# Patient Record
Sex: Male | Born: 1990 | Race: Black or African American | Hispanic: No | Marital: Single | Smoking: Never smoker
Health system: Southern US, Community
[De-identification: ages and names within clinical notes are randomized; demographics above are authoritative.]

---

## 2015-03-11 ENCOUNTER — Ambulatory Visit (INDEPENDENT_AMBULATORY_CARE_PROVIDER_SITE_OTHER): Payer: BC Managed Care – PPO | Admitting: Family Medicine

## 2015-03-11 VITALS — BP 128/80 | HR 72 | Temp 98.2°F | Resp 16 | Ht 70.0 in | Wt 238.0 lb

## 2015-03-11 DIAGNOSIS — Z0000 Encounter for general adult medical examination without abnormal findings: Secondary | ICD-10-CM

## 2015-03-11 DIAGNOSIS — Z113 Encounter for screening for infections with a predominantly sexual mode of transmission: Secondary | ICD-10-CM

## 2015-03-11 NOTE — Progress Notes (Signed)
**Note Kross-Identified via Obfuscation** Subjective:     Shawn Flowers is an 25 y.o. male who presents for general physical exam. He denies any current medical problems or concerns. Questionnaire and forms reviewed with patient and responses verified. Immunizations are up to date. Patient has received 2 doses of MMR vaccine. Did not receive influenza shot this year but not interested.  Denies smoking history, social drinking, denies drug use, safe sex practices.    The following portions of the patient's history were reviewed and updated as appropriate: allergies, current medications, past family history, past medical history, past social history, past surgical history and problem list.  Review of Systems Pertinent items are noted in HPI.     Objective:    BP 128/80 mmHg  Pulse 72  Temp(Src) 98.2 F (36.8 C) (Oral)  Resp 16  Ht _0  (1.778 m)  Wt 238 lb (107.956 kg)  BMI 34.15 kg/m2  SpO2 98% BP 128/80 mmHg  Pulse 72  Temp(Src) 98.2 F (36.8 C) (Oral)  Resp 16  Ht _1  (1.778 m)  Wt 238 lb (107.956 kg)  BMI 34.15 kg/m2  SpO2 98% General appearance: alert, cooperative and appears stated age Head: Normocephalic, without obvious abnormality, atraumatic Lungs: clear to auscultation bilaterally Chest wall: no tenderness Heart: normal apical impulse and regular rate and rhythm Abdomen: soft, non-tender; bowel sounds normal; no masses,  no organomegaly Pulses: 2+ and symmetric    Assessment:     Healthy male, complete physical .    Plan:    1. Completed, signed and returned forms. 2. Reviewed health maintenance, contraception, STDs, and substance abuse. 3. Follow up will be as needed.

## 2015-03-12 LAB — HIV ANTIBODY (ROUTINE TESTING W REFLEX): HIV: NONREACTIVE

## 2015-03-12 LAB — RPR

## 2015-03-13 LAB — GC/CHLAMYDIA PROBE AMP
CT Probe RNA: NOT DETECTED
GC Probe RNA: NOT DETECTED

## 2016-07-07 ENCOUNTER — Ambulatory Visit (HOSPITAL_COMMUNITY)
Admission: EM | Admit: 2016-07-07 | Discharge: 2016-07-07 | Disposition: A | Discharge status: Home / Self Care | Payer: Self-pay | Attending: Internal Medicine | Admitting: Internal Medicine

## 2016-07-07 ENCOUNTER — Encounter (HOSPITAL_COMMUNITY): Payer: Self-pay | Admitting: Emergency Medicine

## 2016-07-07 DIAGNOSIS — M545 Low back pain: Secondary | ICD-10-CM

## 2016-07-07 DIAGNOSIS — S39012A Strain of muscle, fascia and tendon of lower back, initial encounter: Secondary | ICD-10-CM

## 2016-07-07 MED ORDER — NAPROXEN 500 MG PO TABS: 500.0000 mg | ORAL_TABLET | Freq: Two times a day (BID) | ORAL | 0 refills | Status: AC

## 2016-07-07 MED ORDER — CYCLOBENZAPRINE HCL 10 MG PO TABS: 10.0000 mg | ORAL_TABLET | Freq: Two times a day (BID) | ORAL | 0 refills | Status: AC | PRN
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
## 2018-05-10 ENCOUNTER — Other Ambulatory Visit: Payer: Self-pay

## 2018-05-10 ENCOUNTER — Emergency Department (HOSPITAL_COMMUNITY): Payer: BC Managed Care – PPO

## 2018-05-10 ENCOUNTER — Emergency Department (HOSPITAL_COMMUNITY)
Admission: EM | Admit: 2018-05-10 | Discharge: 2018-05-10 | Disposition: A | Discharge status: Home / Self Care | Payer: BC Managed Care – PPO | Attending: Emergency Medicine | Admitting: Emergency Medicine

## 2018-05-10 ENCOUNTER — Encounter (HOSPITAL_COMMUNITY): Payer: Self-pay | Admitting: Emergency Medicine

## 2018-05-10 DIAGNOSIS — Z209 Contact with and (suspected) exposure to unspecified communicable disease: Secondary | ICD-10-CM | POA: Insufficient documentation

## 2018-05-10 DIAGNOSIS — J111 Influenza due to unidentified influenza virus with other respiratory manifestations: Secondary | ICD-10-CM | POA: Insufficient documentation

## 2018-05-10 DIAGNOSIS — R69 Illness, unspecified: Secondary | ICD-10-CM

## 2018-05-10 DIAGNOSIS — R55 Syncope and collapse: Secondary | ICD-10-CM

## 2018-05-10 DIAGNOSIS — R5383 Other fatigue: Secondary | ICD-10-CM | POA: Insufficient documentation

## 2018-05-10 LAB — CBC WITH DIFFERENTIAL/PLATELET
Abs Immature Granulocytes: 0.04 10*3/uL (ref 0.00–0.07)
BASOS ABS: 0 10*3/uL (ref 0.0–0.1)
Basophils Relative: 1 %
Eosinophils Absolute: 0 10*3/uL (ref 0.0–0.5)
Eosinophils Relative: 1 %
HCT: 48.6 % (ref 39.0–52.0)
Hemoglobin: 15 g/dL (ref 13.0–17.0)
Immature Granulocytes: 1 %
Lymphocytes Relative: 17 %
Lymphs Abs: 0.9 10*3/uL (ref 0.7–4.0)
MCH: 28.5 pg (ref 26.0–34.0)
MCHC: 30.9 g/dL (ref 30.0–36.0)
MCV: 92.2 fL (ref 80.0–100.0)
Monocytes Absolute: 0.9 10*3/uL (ref 0.1–1.0)
Monocytes Relative: 16 %
NEUTROS PCT: 64 %
NRBC: 0 % (ref 0.0–0.2)
Neutro Abs: 3.5 10*3/uL (ref 1.7–7.7)
Platelets: 205 10*3/uL (ref 150–400)
RBC: 5.27 MIL/uL (ref 4.22–5.81)
RDW: 13.8 % (ref 11.5–15.5)
WBC: 5.3 10*3/uL (ref 4.0–10.5)

## 2018-05-10 LAB — URINALYSIS, ROUTINE W REFLEX MICROSCOPIC
Bilirubin Urine: NEGATIVE
Glucose, UA: NEGATIVE mg/dL
Hgb urine dipstick: NEGATIVE
Ketones, ur: NEGATIVE mg/dL
Nitrite: NEGATIVE
Protein, ur: 100 mg/dL — AB
SPECIFIC GRAVITY, URINE: 1.031 — AB (ref 1.005–1.030)
pH: 6 (ref 5.0–8.0)

## 2018-05-10 LAB — BASIC METABOLIC PANEL
Anion gap: 9 (ref 5–15)
BUN: 14 mg/dL (ref 6–20)
CO2: 25 mmol/L (ref 22–32)
Calcium: 9.5 mg/dL (ref 8.9–10.3)
Chloride: 103 mmol/L (ref 98–111)
Creatinine, Ser: 1.21 mg/dL (ref 0.61–1.24)
Glucose, Bld: 100 mg/dL — ABNORMAL HIGH (ref 70–99)
Potassium: 4.9 mmol/L (ref 3.5–5.1)
Sodium: 137 mmol/L (ref 135–145)

## 2018-05-10 LAB — INFLUENZA PANEL BY PCR (TYPE A & B)
Influenza A By PCR: NEGATIVE
Influenza B By PCR: NEGATIVE

## 2018-05-10 LAB — CBG MONITORING, ED: Glucose-Capillary: 86 mg/dL (ref 70–99)

## 2018-05-10 MED ORDER — BENZONATATE 100 MG PO CAPS: 200.0000 mg | ORAL_CAPSULE | Freq: Three times a day (TID) | ORAL | 0 refills | Status: DC

## 2018-05-10 MED ORDER — ACETAMINOPHEN 325 MG PO TABS
650.0000 mg | ORAL_TABLET | Freq: Once | ORAL | Status: AC
  Administered 2018-05-10: 650 mg via ORAL
  Filled 2018-05-10: qty 2

## 2018-05-10 MED ORDER — BENZONATATE 100 MG PO CAPS: 200.0000 mg | ORAL_CAPSULE | Freq: Three times a day (TID) | ORAL | 0 refills | Status: AC

## 2018-05-10 MED ORDER — SODIUM CHLORIDE 0.9 % IV BOLUS
1000.0000 mL | Freq: Once | INTRAVENOUS | Status: AC
  Administered 2018-05-10: 1000 mL via INTRAVENOUS

## 2018-05-10 NOTE — ED Provider Notes (Signed)
**Note Quandre-Identified via Obfuscation**  [facility]   CSN: [id number] Arrival date & time: 05/10/18  1143    History   Chief Complaint Chief Complaint  Patient presents with  . Loss of Consciousness    HPI Shawn Flowers is a 28 y.o. male who presents to ED for syncopal episode that occurred prior to arrival. States that since yesterday, he has been having subjective fever, chills, generalized bodyaches, dry cough. He woke up this morning feeling a little better so he ate a bowl of chili. He then went to the shower while he was still feeling feverish. After he finished the shower, he felt lightheaded, passed out and feels that he hit his head on the door. His roommate called EMS and patient states he came back to consciousness after a few minutes. He had one episode of NBNB emesis after this. He states right now he feels "tired but fine." He notes decreased appetite. He was around his friend who had flu like symptoms. He did not receive the influenza vaccine this year (or ever). Denies any recent international travel or contact with [phone]. He denies shortness of breath, chest pain, vision changes, neck pain, headache, numbness in arms or legs, abdominal pain.     HPI  History reviewed. No pertinent past medical history.  There are no active problems to display for this patient.   History reviewed. No pertinent surgical history.      Home Medications    Prior to Admission medications   Medication Sig Start Date End Date Taking? Authorizing Provider  PE-Doxylamine-DM-GG-APAP (TYLENOL COLD & FLU DAY/NIGHT PO) Take 2 tablets by mouth daily as needed (cold symptoms).   Yes [provider]  benzonatate (TESSALON) 100 MG capsule Take 2 capsules (200 mg total) by mouth every 8 (eight) hours. 05/10/18   Shawn Laskaris, PA-C  cyclobenzaprine (FLEXERIL) 10 MG tablet Take 1 tablet (10 mg total) by mouth 2 (two) times daily as needed for muscle spasms. Patient not taking:  Reported on 05/10/2018 07/07/16   Deatra Canter, FNP  naproxen (NAPROSYN) 500 MG tablet Take 1 tablet (500 mg total) by mouth 2 (two) times daily with a meal. Patient not taking: Reported on 05/10/2018 07/07/16   Deatra Canter, FNP    Family History Family History  Problem Relation Age of Onset  . Cancer Maternal Grandmother   . Diabetes Maternal Grandfather   . Heart disease Paternal Grandfather   . Stroke Paternal Grandfather     Social History Social History   Tobacco Use  . Smoking status: Never Smoker  . Smokeless tobacco: Never Used  Substance Use Topics  . Alcohol use: Yes    Alcohol/week: 3.0 standard drinks    Types: 3 Standard drinks or equivalent per week  . Drug use: No     Allergies   Patient has no known allergies.   Review of Systems Review of Systems  Constitutional: Positive for fever. Negative for appetite change and chills.  HENT: Positive for congestion. Negative for ear pain, rhinorrhea, sneezing and sore throat.   Eyes: Negative for photophobia and visual disturbance.  Respiratory: Positive for cough. Negative for chest tightness, shortness of breath and wheezing.   Cardiovascular: Negative for chest pain and palpitations.  Gastrointestinal: Positive for vomiting. Negative for abdominal pain, blood in stool, constipation, diarrhea and nausea.  Genitourinary: Negative for dysuria, hematuria and urgency.  Musculoskeletal: Positive for myalgias.  Skin: Negative for rash.  Neurological: Positive for syncope. Negative for dizziness, weakness **Note [name] via Obfuscation** and light-headedness.     Physical Exam Updated Vital Signs BP 135/74   Pulse 76   Temp 99.9 F (37.7 C) (Oral)   Resp (!) 23   Ht 5\' 11"  (1.803 m)   Wt 117.9 kg   SpO2 99%   BMI 36.26 kg/m   Physical Exam Vitals signs and nursing note reviewed.  Constitutional:      General: He is not in acute distress.    Appearance: He is well-developed.  HENT:     Head: Normocephalic and atraumatic.      Right Ear: Tympanic membrane is not scarred.     Left Ear: Tympanic membrane is scarred.     Nose: Nose normal.     Mouth/Throat:     Pharynx: Oropharynx is clear. Uvula midline.  Eyes:     General: No scleral icterus.       Right eye: No discharge.        Left eye: No discharge.     Conjunctiva/sclera: Conjunctivae normal.     Pupils: Pupils are equal, round, and reactive to light.  Neck:     Musculoskeletal: Normal range of motion and neck supple.  Cardiovascular:     Rate and Rhythm: Normal rate and regular rhythm.     Heart sounds: Normal heart sounds. No murmur. No friction rub. No gallop.   Pulmonary:     Effort: Pulmonary effort is normal. No respiratory distress.     Breath sounds: Normal breath sounds.  Abdominal:     General: Bowel sounds are normal. There is no distension.     Palpations: Abdomen is soft.     Tenderness: There is no abdominal tenderness. There is no right CVA tenderness, left CVA tenderness or guarding.  Musculoskeletal: Normal range of motion.     Comments: No midline spinal tenderness present in lumbar, thoracic or cervical spine. No step-off palpated. No visible bruising, edema or temperature change noted. No objective signs of numbness present. No saddle anesthesia. 2+ DP pulses bilaterally. Sensation intact to light touch. Strength 5/5 in bilateral lower extremities.  Skin:    General: Skin is warm and dry.     Findings: No rash.  Neurological:     General: No focal deficit present.     Mental Status: He is alert and oriented to person, place, and time.     Cranial Nerves: No cranial nerve deficit.     Sensory: No sensory deficit.     Motor: No weakness or abnormal muscle tone.     Coordination: Coordination normal.     Comments: Pupils reactive. No facial asymmetry noted. Cranial nerves appear grossly intact. Sensation intact to light touch on face, BUE and BLE. Strength 5/5 in BUE and BLE.       ED Treatments / Results  Labs (all labs  ordered are listed, but only abnormal results are displayed) Labs Reviewed  BASIC METABOLIC PANEL - Abnormal; Notable for the following components:      Result Value   Glucose, Bld 100 (*)    All other components within normal limits  URINALYSIS, ROUTINE W REFLEX MICROSCOPIC - Abnormal; Notable for the following components:   Color, Urine AMBER (*)    APPearance HAZY (*)    Specific Gravity, Urine 1.031 (*)    Protein, ur 100 (*)    Leukocytes,Ua TRACE (*)    Bacteria, UA RARE (*)    All other components within normal limits  URINE CULTURE  CBC WITH DIFFERENTIAL/PLATELET  INFLUENZA PANEL **Note Zaccheaus-Identified via Obfuscation** BY PCR (TYPE A & B)  CBG MONITORING, ED    EKG EKG Interpretation  Date/Time:  Thursday May 10 2018 11:56:59 EDT Ventricular Rate:  90 PR Interval:    QRS Duration: 97 QT Interval:  328 QTC Calculation: 402 R Axis:   86 Text Interpretation:  Sinus rhythm Ventricular premature complex RSR' in V1 or V2, right VCD or RVH ST elev, probable normal early repol pattern Confirmed by Benjiman Core [phone]) on 05/10/2018 2:37:05 PM   Radiology Dg Chest 2 View  Result Date: 05/10/2018 CLINICAL DATA:  Syncopal episode at home, fell in shower, increased shortness of breath, fever EXAM: CHEST - 2 VIEW COMPARISON:  None FINDINGS: Normal heart size, mediastinal contours, and pulmonary vascularity. Lungs clear. No pleural effusion or pneumothorax. Bones unremarkable. IMPRESSION: Normal exam. Electronically Signed   By: Ulyses Southward M.D.   On: 05/10/2018 12:37   Ct Head Wo Contrast  Result Date: 05/10/2018 CLINICAL DATA:  Dizziness and syncopal episode in the shower today. EXAM: CT HEAD WITHOUT CONTRAST TECHNIQUE: Contiguous axial images were obtained from the base of the skull through the vertex without intravenous contrast. COMPARISON:  None. FINDINGS: Brain: The brain shows a normal appearance without evidence of malformation, atrophy, old or acute small or large vessel infarction, mass lesion,  hemorrhage, hydrocephalus or extra-axial collection. Vascular: No hyperdense vessel. No evidence of atherosclerotic calcification. Skull: Normal.  No traumatic finding.  No focal bone lesion. Sinuses/Orbits: Sinuses are clear. Orbits appear normal. Mastoids are clear. Other: None significant IMPRESSION: Normal head CT Electronically Signed   By: Paulina Fusi M.D.   On: 05/10/2018 13:18    Procedures Procedures (including critical care time)  Medications Ordered in ED Medications  sodium chloride 0.9 % bolus 1,000 mL (0 mLs Intravenous Stopped 05/10/18 1353)  acetaminophen (TYLENOL) tablet 650 mg (650 mg Oral Given 05/10/18 1338)     Initial Impression / Assessment and Plan / ED Course  I have reviewed the triage vital signs and the nursing notes.  Pertinent labs & imaging results that were available during my care of the patient were reviewed by me and considered in my medical decision making (see chart for details).  Clinical Course as of May 10 1435  Thu May 10, 2018  1327 Orthostatic Vital Signs Orthostatic Lying  BP- Lying:134/87 Pulse- Lying:74 Orthostatic Sitting BP- Sitting:127/83 Pulse- Sitting:75 Orthostatic Standing at 0 minutes BP- Standing at 0 minutes:139/71 Pulse- Standing at 0 minutes:76   [HK]    Clinical Course User Index [HK] Dietrich Pates, PA-C       28 year old male presents to ED for syncopal episode that occurred prior to arrival.  He has been having subjective fever and chills, body aches and dry cough since yesterday.  He woke up this morning feeling a little bit better so he ate a meal.  When he went to the shower he still felt feverish and as he exited the shower felt lightheaded, passed out and hit his head on the floor.  Roommate states that he was unconscious for a few minutes.  He had one episode of nonbloody, nonbilious emesis after he regained consciousness.  States he overall feels well now but a little bit "tired."  He does note decreased  appetite.  Sick contacts including friend with flulike symptoms recently.  Denies any recent international travel or contact with anyone positive for the novel coronavirus.  Denies shortness of breath, chest pain, neck pain, headache, numbness in arms or legs. On my exam he is **Note Dejean-Identified via Obfuscation** overall well appearing. Alert, oriented x3 with no deficits to neurological exam noted. No C,T,L spine TTP. Abdomen is soft, ntnd. His vital signs are within normal limits. No signs of head trauma or bruising noted. Flu swab negative today. CBC, BMP unremarkable. UA shows protein, rare leuks and bacteria, sent for culture. CBG in 80s, patient has normal orthostatic vital signs. CXR, CT head negative. EKG shows early repol changes with ischemic findings. He denies chest pain. Patient given fluids with Tylenol with improvement in his symptoms. He is able to tolerate PO intake without difficulty. Suspect influenza like illness. Syncope could be the result of decreased appetite and fever in shower. No signs of neurological, cardiac cause of symptoms. Patient requesting discharge. Will have him take tessalon perles as needed for cough and return for any severe or worsening symptoms.  Patient is hemodynamically stable, in NAD, and able to ambulate in the ED. Evaluation does not show pathology that would require ongoing emergent intervention or inpatient treatment. I explained the diagnosis to the patient. Pain has been managed and has no complaints prior to discharge. Patient is comfortable with above plan and is stable for discharge at this time. All questions were answered prior to disposition. Strict return precautions for returning to the ED were discussed. Encouraged follow up with PCP.    Portions of this note were generated with Scientist, clinical (histocompatibility and immunogenetics). Dictation errors may occur despite best attempts at proofreading.   Final Clinical Impressions(s) / ED Diagnoses   Final diagnoses:  Influenza-like illness  Syncope, unspecified  syncope type    ED Discharge Orders         Ordered    benzonatate (TESSALON) 100 MG capsule  Every 8 hours,   Status:  Discontinued     05/10/18 1433    benzonatate (TESSALON) 100 MG capsule  Every 8 hours     05/10/18 1437           Dietrich Pates, PA-C 05/10/18 1438    Benjiman Core, MD 05/10/18 1514

## 2018-05-10 NOTE — ED Triage Notes (Signed)
**Note [name]-Identified via Obfuscation** Pt reports that he was in the shower this morning and got lightheaded then did have a syncopal episode. Reports that he vomited after he "came to". Pt said EMS came to house and told him had fever and high blood pressure.  Pt reports end of Feb traveled to [place] and [place].

## 2018-05-10 NOTE — ED Notes (Signed)
**Note Martice-identified via Obfuscation** Gave pt turkey sandwich and water 

## 2018-05-10 NOTE — Discharge Instructions (Signed)
**Note Camaron-Identified via Obfuscation** Take the tessalon perles as needed for cough. Your flu swab today was negative. Your other labwork and imaging was negative for any acute abnormality. Return to ED if you start to have worsening symptoms, shortness of breath, chest pain, numbness in arms or legs or blurry vision.

## 2018-05-10 NOTE — ED Notes (Signed)
**Note Lark-identified via Obfuscation** ED Provider at bedside. 

## 2018-05-11 LAB — URINE CULTURE: Culture: <10000 — AB

## 2020-01-25 IMAGING — CR CHEST - 2 VIEW
2 series · 2 of 2 positions shown · non-contrast
Comparison: None

CLINICAL DATA: Syncopal episode at home, fell in shower, increased
shortness of breath, fever

EXAM:
CHEST - 2 VIEW

[w chest pa]
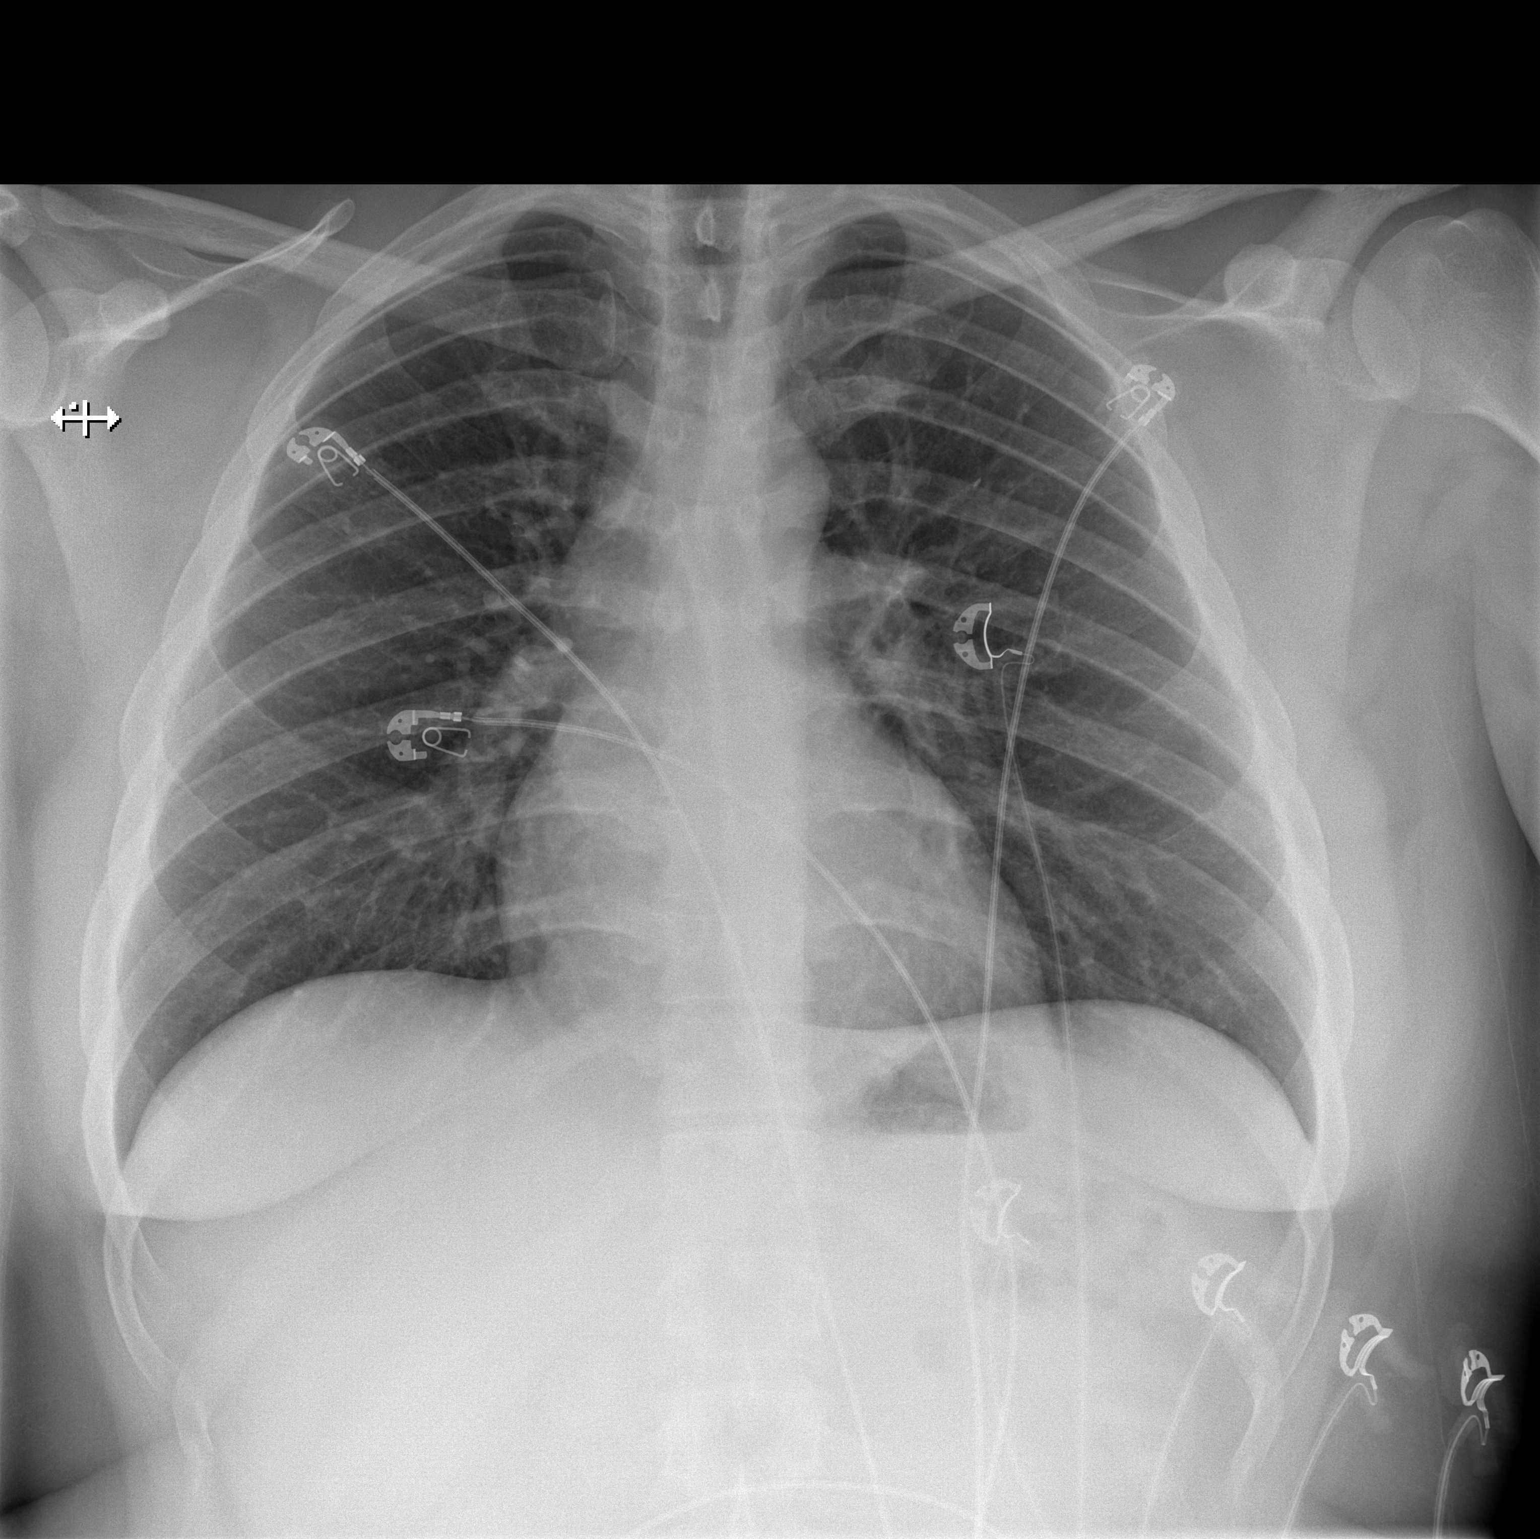

[w chest lat]
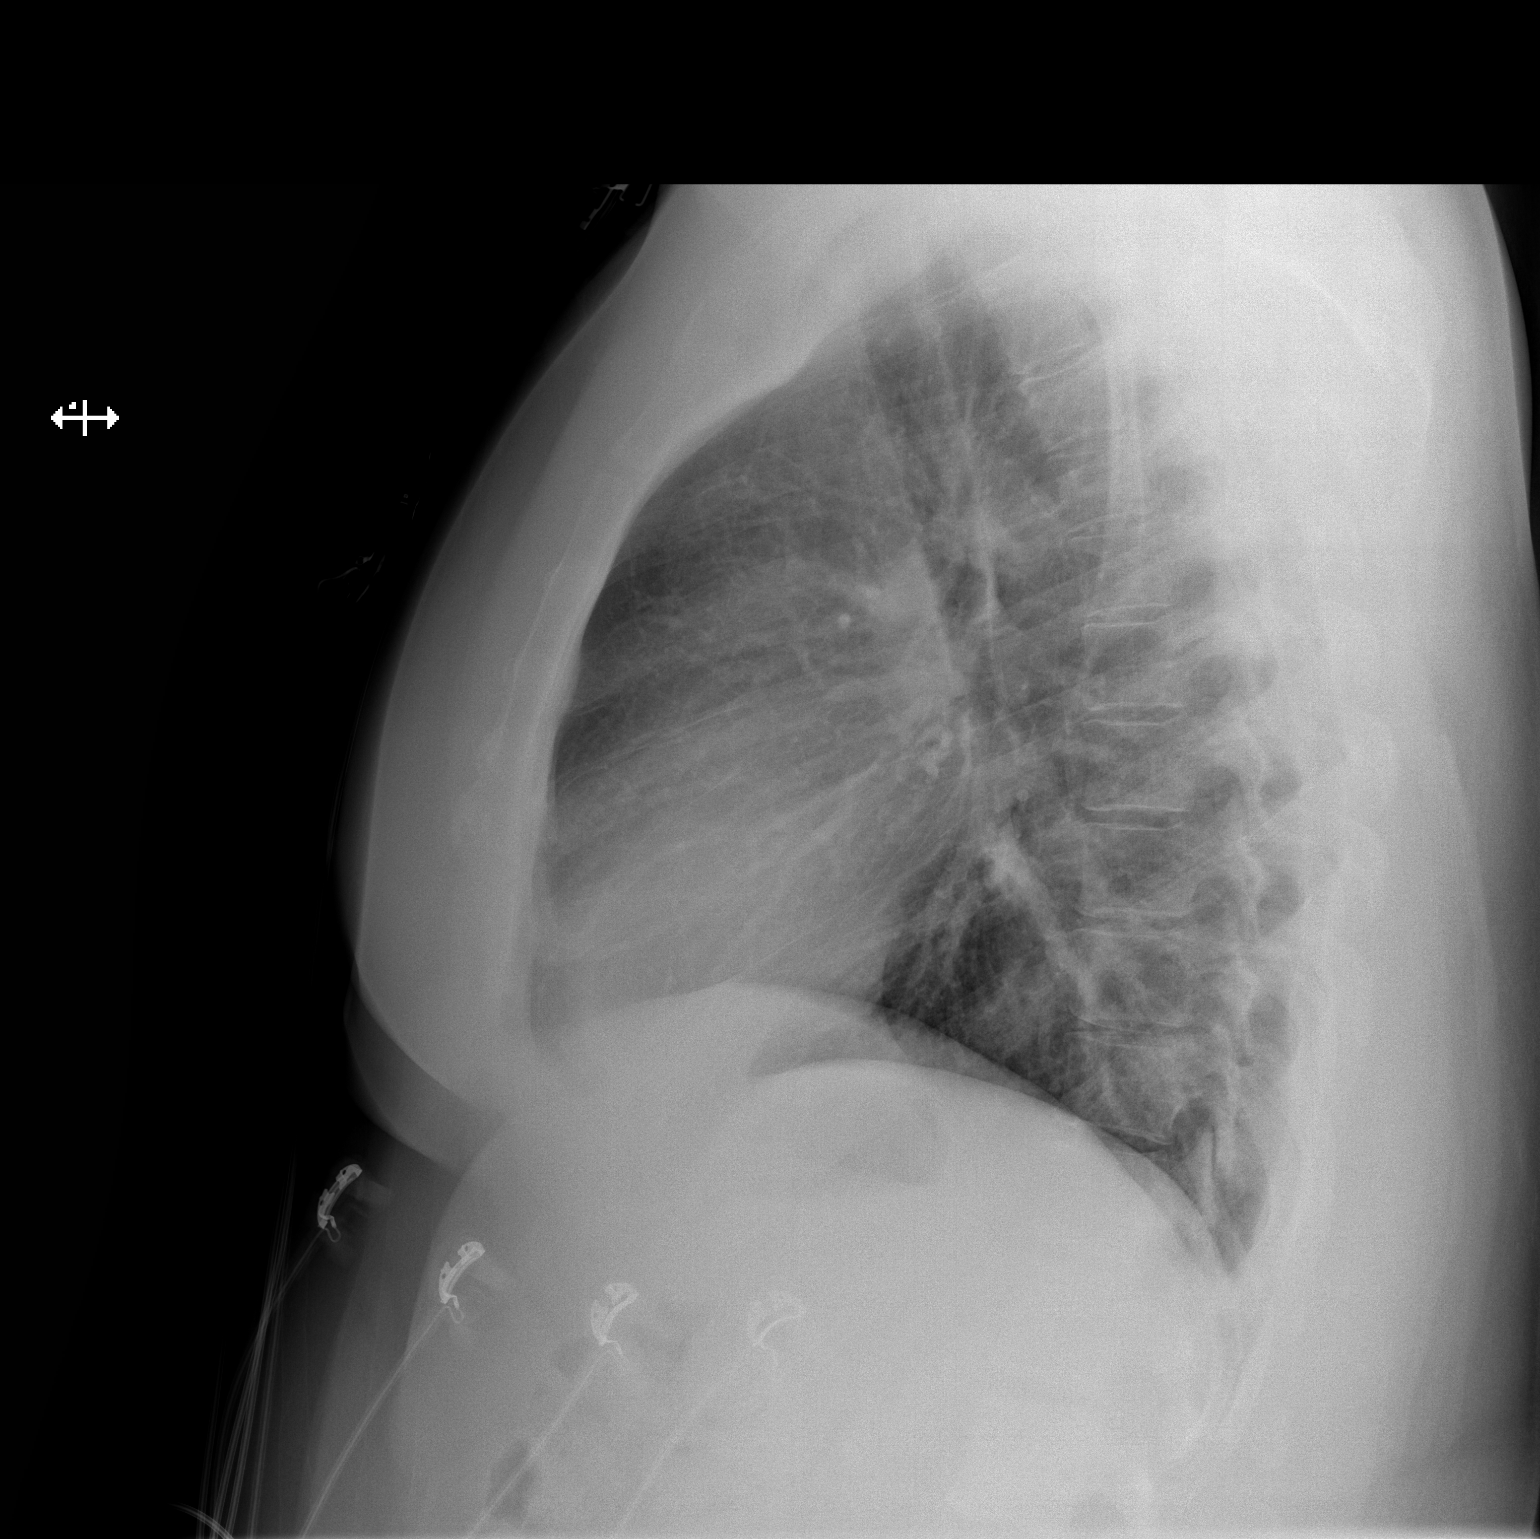

[2 of 2 positions shown; findings below may reference images not displayed]

FINDINGS: Normal heart size, mediastinal contours, and pulmonary vascularity.

Lungs clear.

No pleural effusion or pneumothorax.

Bones unremarkable.
IMPRESSION: Normal exam.
